# Patient Record
Sex: Female | Born: 1960 | Race: White | Hispanic: No | Marital: Married | Smoking: Light tobacco smoker
Health system: Southern US, Community
[De-identification: ages and names within clinical notes are randomized; demographics above are authoritative.]

---

## 2019-01-21 ENCOUNTER — Emergency Department: Payer: BLUE CROSS/BLUE SHIELD | Admitting: Anesthesiology

## 2019-01-21 ENCOUNTER — Encounter: Payer: Self-pay | Admitting: Emergency Medicine

## 2019-01-21 ENCOUNTER — Emergency Department: Payer: BLUE CROSS/BLUE SHIELD

## 2019-01-21 ENCOUNTER — Encounter
Admission: EM | Disposition: A | Discharge status: Home / Self Care | Payer: Self-pay | Source: Home / Self Care | Attending: Emergency Medicine

## 2019-01-21 ENCOUNTER — Emergency Department
Admission: EM | Admit: 2019-01-21 | Discharge: 2019-01-21 | Disposition: A | Discharge status: Home / Self Care | Payer: BLUE CROSS/BLUE SHIELD | Attending: Emergency Medicine | Admitting: Emergency Medicine

## 2019-01-21 ENCOUNTER — Other Ambulatory Visit: Payer: Self-pay

## 2019-01-21 DIAGNOSIS — X58XXXA Exposure to other specified factors, initial encounter: Secondary | ICD-10-CM | POA: Insufficient documentation

## 2019-01-21 DIAGNOSIS — T18128A Food in esophagus causing other injury, initial encounter: Secondary | ICD-10-CM

## 2019-01-21 DIAGNOSIS — K209 Esophagitis, unspecified without bleeding: Secondary | ICD-10-CM

## 2019-01-21 DIAGNOSIS — K222 Esophageal obstruction: Secondary | ICD-10-CM | POA: Diagnosis present

## 2019-01-21 DIAGNOSIS — T18108A Unspecified foreign body in esophagus causing other injury, initial encounter: Secondary | ICD-10-CM

## 2019-01-21 DIAGNOSIS — F17210 Nicotine dependence, cigarettes, uncomplicated: Secondary | ICD-10-CM | POA: Insufficient documentation

## 2019-01-21 DIAGNOSIS — R1310 Dysphagia, unspecified: Secondary | ICD-10-CM | POA: Insufficient documentation

## 2019-01-21 DIAGNOSIS — R911 Solitary pulmonary nodule: Secondary | ICD-10-CM | POA: Diagnosis not present

## 2019-01-21 DIAGNOSIS — Z1159 Encounter for screening for other viral diseases: Secondary | ICD-10-CM | POA: Insufficient documentation

## 2019-01-21 DIAGNOSIS — R1319 Other dysphagia: Secondary | ICD-10-CM

## 2019-01-21 DIAGNOSIS — R0789 Other chest pain: Secondary | ICD-10-CM | POA: Insufficient documentation

## 2019-01-21 DIAGNOSIS — Z79899 Other long term (current) drug therapy: Secondary | ICD-10-CM | POA: Diagnosis not present

## 2019-01-21 HISTORY — PX: ESOPHAGOGASTRODUODENOSCOPY: SHX5428

## 2019-01-21 LAB — CBC WITH DIFFERENTIAL/PLATELET
Abs Immature Granulocytes: 0.04 10*3/uL (ref 0.00–0.07)
Basophils Absolute: 0.1 10*3/uL (ref 0.0–0.1)
Basophils Relative: 1 %
Eosinophils Absolute: 0.2 10*3/uL (ref 0.0–0.5)
Eosinophils Relative: 3 %
HCT: 44.2 % (ref 36.0–46.0)
Hemoglobin: 15 g/dL (ref 12.0–15.0)
Immature Granulocytes: 1 %
Lymphocytes Relative: 19 %
Lymphs Abs: 1.7 10*3/uL (ref 0.7–4.0)
MCH: 31.7 pg (ref 26.0–34.0)
MCHC: 33.9 g/dL (ref 30.0–36.0)
MCV: 93.4 fL (ref 80.0–100.0)
Monocytes Absolute: 0.4 10*3/uL (ref 0.1–1.0)
Monocytes Relative: 5 %
Neutro Abs: 6.2 10*3/uL (ref 1.7–7.7)
Neutrophils Relative %: 71 %
Platelets: 278 10*3/uL (ref 150–400)
RBC: 4.73 MIL/uL (ref 3.87–5.11)
RDW: 13.5 % (ref 11.5–15.5)
WBC: 8.6 10*3/uL (ref 4.0–10.5)
nRBC: 0 % (ref 0.0–0.2)

## 2019-01-21 LAB — BASIC METABOLIC PANEL
Anion gap: 12 (ref 5–15)
BUN: 15 mg/dL (ref 6–20)
CO2: 23 mmol/L (ref 22–32)
Calcium: 9.4 mg/dL (ref 8.9–10.3)
Chloride: 110 mmol/L (ref 98–111)
Creatinine, Ser: 0.5 mg/dL (ref 0.44–1.00)
GFR calc Af Amer: >60 mL/min (ref >60)
GFR calc non Af Amer: >60 mL/min (ref >60)
Glucose, Bld: 100 mg/dL — ABNORMAL HIGH (ref 70–99)
Potassium: 3.8 mmol/L (ref 3.5–5.1)
Sodium: 145 mmol/L (ref 135–145)

## 2019-01-21 LAB — SARS CORONAVIRUS 2 BY RT PCR (HOSPITAL ORDER, PERFORMED IN ~~LOC~~ HOSPITAL LAB): SARS Coronavirus 2: NEGATIVE

## 2019-01-21 SURGERY — EGD (ESOPHAGOGASTRODUODENOSCOPY)
Anesthesia: General | Laterality: Left

## 2019-01-21 MED ORDER — PROPOFOL 500 MG/50ML IV EMUL
INTRAVENOUS | Status: AC
  Filled 2019-01-21: qty 50

## 2019-01-21 MED ORDER — ONDANSETRON HCL 4 MG/2ML IJ SOLN
INTRAMUSCULAR | Status: AC
  Filled 2019-01-21: qty 2

## 2019-01-21 MED ORDER — SODIUM CHLORIDE 0.9 % IV SOLN
INTRAVENOUS | Status: DC
  Administered 2019-01-21: 1000 mL via INTRAVENOUS

## 2019-01-21 MED ORDER — FENTANYL CITRATE (PF) 100 MCG/2ML IJ SOLN: 25.0000 ug | INTRAMUSCULAR | Status: DC | PRN

## 2019-01-21 MED ORDER — SUCCINYLCHOLINE CHLORIDE 20 MG/ML IJ SOLN
INTRAMUSCULAR | Status: DC | PRN
  Administered 2019-01-21: 100 mg via INTRAVENOUS

## 2019-01-21 MED ORDER — FENTANYL CITRATE (PF) 100 MCG/2ML IJ SOLN
INTRAMUSCULAR | Status: DC | PRN
  Administered 2019-01-21: 100 ug via INTRAVENOUS

## 2019-01-21 MED ORDER — LIDOCAINE HCL (PF) 2 % IJ SOLN
INTRAMUSCULAR | Status: AC
  Filled 2019-01-21: qty 10

## 2019-01-21 MED ORDER — GLYCOPYRROLATE 0.2 MG/ML IJ SOLN
INTRAMUSCULAR | Status: DC | PRN
  Administered 2019-01-21: 0.2 mg via INTRAVENOUS

## 2019-01-21 MED ORDER — ACETAMINOPHEN 325 MG PO TABS: 325.0000 mg | ORAL_TABLET | ORAL | Status: DC | PRN

## 2019-01-21 MED ORDER — PROMETHAZINE HCL 25 MG/ML IJ SOLN: 6.2500 mg | INTRAMUSCULAR | Status: DC | PRN

## 2019-01-21 MED ORDER — PROPOFOL 500 MG/50ML IV EMUL
INTRAVENOUS | Status: DC | PRN
  Administered 2019-01-21: 125 ug/kg/min via INTRAVENOUS

## 2019-01-21 MED ORDER — MEPERIDINE HCL 25 MG/ML IJ SOLN
6.2500 mg | INTRAMUSCULAR | Status: DC | PRN
  Filled 2019-01-21: qty 1

## 2019-01-21 MED ORDER — ACETAMINOPHEN 160 MG/5ML PO SOLN
325.0000 mg | ORAL | Status: DC | PRN
  Filled 2019-01-21: qty 20.3

## 2019-01-21 MED ORDER — GLUCAGON HCL RDNA (DIAGNOSTIC) 1 MG IJ SOLR
1.0000 mg | Freq: Once | INTRAMUSCULAR | Status: AC
  Administered 2019-01-21: 1 mg via INTRAVENOUS

## 2019-01-21 MED ORDER — GLUCAGON HCL RDNA (DIAGNOSTIC) 1 MG IJ SOLR
INTRAMUSCULAR | Status: AC
  Administered 2019-01-21: 09:00:00 1 mg via INTRAVENOUS
  Filled 2019-01-21: qty 1

## 2019-01-21 MED ORDER — MORPHINE SULFATE (PF) 4 MG/ML IV SOLN
4.0000 mg | Freq: Once | INTRAVENOUS | Status: AC
  Administered 2019-01-21: 4 mg via INTRAVENOUS
  Filled 2019-01-21: qty 1

## 2019-01-21 MED ORDER — LIDOCAINE HCL (CARDIAC) PF 100 MG/5ML IV SOSY
PREFILLED_SYRINGE | INTRAVENOUS | Status: DC | PRN
  Administered 2019-01-21: 50 mg via INTRAVENOUS

## 2019-01-21 MED ORDER — ONDANSETRON HCL 4 MG/2ML IJ SOLN
4.0000 mg | Freq: Once | INTRAMUSCULAR | Status: AC
  Administered 2019-01-21: 4 mg via INTRAVENOUS
  Filled 2019-01-21: qty 2

## 2019-01-21 MED ORDER — ONDANSETRON HCL 4 MG/2ML IJ SOLN
INTRAMUSCULAR | Status: DC | PRN
  Administered 2019-01-21: 4 mg via INTRAVENOUS

## 2019-01-21 MED ORDER — MIDAZOLAM HCL 2 MG/2ML IJ SOLN
INTRAMUSCULAR | Status: AC
  Filled 2019-01-21: qty 2

## 2019-01-21 MED ORDER — PANTOPRAZOLE SODIUM 20 MG PO TBEC: 20.0000 mg | DELAYED_RELEASE_TABLET | Freq: Two times a day (BID) | ORAL | 0 refills | Status: AC

## 2019-01-21 MED ORDER — GLYCOPYRROLATE 0.2 MG/ML IJ SOLN
INTRAMUSCULAR | Status: AC
  Filled 2019-01-21: qty 1

## 2019-01-21 MED ORDER — SODIUM CHLORIDE 0.9 % IV BOLUS
500.0000 mL | Freq: Once | INTRAVENOUS | Status: AC
  Administered 2019-01-21: 09:00:00 500 mL via INTRAVENOUS

## 2019-01-21 MED ORDER — PROPOFOL 10 MG/ML IV BOLUS
INTRAVENOUS | Status: DC | PRN
  Administered 2019-01-21: 150 mg via INTRAVENOUS

## 2019-01-21 MED ORDER — SUCCINYLCHOLINE CHLORIDE 20 MG/ML IJ SOLN
INTRAMUSCULAR | Status: AC
  Filled 2019-01-21: qty 1

## 2019-01-21 MED ORDER — FENTANYL CITRATE (PF) 100 MCG/2ML IJ SOLN
INTRAMUSCULAR | Status: AC
  Filled 2019-01-21: qty 2

## 2019-01-21 MED ORDER — MIDAZOLAM HCL 2 MG/2ML IJ SOLN
INTRAMUSCULAR | Status: DC | PRN
  Administered 2019-01-21: 2 mg via INTRAVENOUS

## 2019-01-21 NOTE — Transfer of Care (Signed)
Immediate Anesthesia Transfer of Care Note  Patient: Deanna Gonzalez  Procedure(s) Performed: [name] (EGD) (Left )  Patient Location: PACU  Anesthesia Type:General  Level of Consciousness: awake, alert  and oriented  Airway & Oxygen Therapy: Patient Spontanous Breathing and Patient connected to face mask oxygen  Post-op Assessment: Report given to RN and Post -op Vital signs reviewed and stable  Post vital signs: Reviewed  Last Vitals:  Vitals Value Taken Time  BP    Temp    Pulse 88 01/21/19 1426  Resp 17 01/21/19 1426  SpO2 97 % 01/21/19 1426  Vitals shown include unvalidated device data.  Last Pain:  Vitals:   01/21/19 1303  TempSrc: Tympanic         Complications: No apparent anesthesia complications

## 2019-01-21 NOTE — ED Notes (Signed)
Endoscopy called and stated they are ready for pt

## 2019-01-21 NOTE — ED Notes (Signed)
Endoscopy called and stated that it would be about an hour before they could get the pt

## 2019-01-21 NOTE — ED Provider Notes (Signed)
[facility] Emergency Department Provider Note  ____________________________________________  Time seen: Approximately 10:01 AM  I have reviewed the triage vital signs and the nursing notes.   HISTORY  Chief Complaint Emesis and Possible Food Bolus    HPI Deanna Gonzalez is a 58 y.o. female with no significant past medical history who was in her usual state of health when she ate some chicken last night as a late dinner and had a sudden onset of low chest pain.  It has been constant without aggravating or alleviating factors, associated with inability to eat or drink anything.  Whenever she drinks water, she vomits it back up within a few minutes.  Pain is nonradiating.  Severe.  She reports in the past she has had episodes of similar chest discomfort that is temporary whenever she is eating fast and not chewing her food well.  Denies any history of esophageal stricture or intervention.      History reviewed. No pertinent past medical history.   There are no active problems to display for this patient.    History reviewed. No pertinent surgical history.   Prior to Admission medications   Not on File   None  Allergies Patient has no known allergies.   History reviewed. No pertinent family history.  Social History Social History   Tobacco Use  . Smoking status: Light Tobacco Smoker  Substance Use Topics  . Alcohol use: Not on file  . Drug use: Not on file  No alcohol use  Review of Systems  Constitutional:   No fever or chills.  ENT:   No sore throat. No rhinorrhea. Cardiovascular:   Positive as above chest pain without syncope. Respiratory:   No dyspnea or cough. Gastrointestinal:   Negative for abdominal pain, vomiting and diarrhea.  Musculoskeletal:   Negative for focal pain or swelling All other systems reviewed and are negative except as documented above in ROS and HPI.  ____________________________________________   PHYSICAL  EXAM:  VITAL SIGNS: ED Triage Vitals  Enc Vitals Group     BP 01/21/19 0812 (!) 154/104     Pulse Rate 01/21/19 0812 79     Resp 01/21/19 0812 18     Temp 01/21/19 0812 98.2 F (36.8 C)     Temp Source 01/21/19 0812 Oral     SpO2 01/21/19 0812 98 %     Weight 01/21/19 0815 148 lb (67.1 kg)     Height 01/21/19 0815 5\' 5"  (1.651 m)     Head Circumference --      Peak Flow --      Pain Score --      Pain Loc --      Pain Edu? --      Excl. in GC? --     Vital signs reviewed, nursing assessments reviewed.   Constitutional:   Alert and oriented. Non-toxic appearance. Eyes:   Conjunctivae are normal. EOMI. PERRL. ENT      Head:   Normocephalic and atraumatic.      Nose:   No congestion/rhinnorhea.       Mouth/Throat:   MMM, no pharyngeal erythema. No peritonsillar mass.       Neck:   No meningismus. Full ROM. Hematological/Lymphatic/Immunilogical:   No cervical lymphadenopathy. Cardiovascular:   RRR. Symmetric bilateral radial and DP pulses.  No murmurs. Cap refill less than 2 seconds. Respiratory:   Normal respiratory effort without tachypnea/retractions. Breath sounds are clear and equal bilaterally. No wheezes/rales/rhonchi. Gastrointestinal:   Soft and  nontender. Non distended. There is no CVA tenderness.  No rebound, rigidity, or guarding.  Musculoskeletal:   Normal range of motion in all extremities. No joint effusions.  No lower extremity tenderness.  No edema. Neurologic:   Normal speech and language.  Motor grossly intact. No acute focal neurologic deficits are appreciated.  Skin:    Skin is warm, dry and intact. No rash noted.  No petechiae, purpura, or bullae.  ____________________________________________    LABS (pertinent positives/negatives) (all labs ordered are listed, but only abnormal results are displayed) Labs Reviewed  BASIC METABOLIC PANEL - Abnormal; Notable for the following components:      Result Value   Glucose, Bld 100 (*)    All other  components within normal limits  SARS CORONAVIRUS 2 (HOSPITAL ORDER, [facility])  CBC WITH DIFFERENTIAL/PLATELET   ____________________________________________   EKG  Interpreted by me Sinus rhythm rate of 85, normal axis and intervals.  Normal QRS ST segments and T waves.  ____________________________________________    RKYHCWCBJ  Dg Chest Portable 1 View  Result Date: 01/21/2019 CLINICAL DATA:  Chest pain. Possible esophageal impaction. EXAM: PORTABLE CHEST 1 VIEW COMPARISON:  None. FINDINGS: Heart size and mediastinal contours are within normal limits. Small dense nodule within the RIGHT lower lung, almost certainly a calcified granuloma. Lungs otherwise clear. No evidence of pneumonia or pulmonary edema. No pleural effusion or pneumothorax seen. Osseous structures about the chest are unremarkable. IMPRESSION: No active disease. No evidence of pneumonia or pulmonary edema. Electronically Signed   By: Franki Cabot M.D.   On: 01/21/2019 08:50    ____________________________________________   PROCEDURES Procedures  ____________________________________________    CLINICAL IMPRESSION / ASSESSMENT AND PLAN / ED COURSE  Medications ordered in the ED: Medications  sodium chloride 0.9 % bolus 500 mL (0 mLs Intravenous Stopped 01/21/19 0944)  glucagon (human recombinant) (GLUCAGEN) injection 1 mg (1 mg Intravenous Given 01/21/19 0856)  morphine 4 MG/ML injection 4 mg (4 mg Intravenous Given 01/21/19 1023)  ondansetron (ZOFRAN) injection 4 mg (4 mg Intravenous Given 01/21/19 1021)    Pertinent labs & imaging results that were available during my care of the patient were reviewed by me and considered in my medical decision making (see chart for details).  Deanna Gonzalez was evaluated in Emergency Department on 01/21/2019 for the symptoms described in the history of present illness. She was evaluated in the context of the global COVID-19 pandemic, which  necessitated consideration that the patient might be at risk for infection with the SARS-CoV-2 virus that causes COVID-19. Institutional protocols and algorithms that pertain to the evaluation of patients at risk for COVID-19 are in a state of rapid change based on information released by regulatory bodies including the CDC and federal and state organizations. These policies and algorithms were followed during the patient's care in the ED.   Patient presents with symptoms of esophageal food impaction.Considering the patient's symptoms, medical history, and physical examination today, I have low suspicion for ACS, PE, TAD, pneumothorax, carditis, mediastinitis, pneumonia, CHF, or sepsis.  Vital signs and EKG unremarkable.  Mildly elevated blood pressure likely due to discomfort.  Patient strongly wishes to avoid endoscopy if at all possible so I will try glucagon and sips of soda to see if this will relieve her symptoms.  Clinical Course as of Jan 21 1056  Sun Jan 21, 2019  0930 Labs and chest x-ray unremarkable.  Follow-up response to glucagon and soda.   [PS]  [place]  negative.  Despite morphine glucagon and sips of soda, no improvement of symptoms.  Will page GI for endoscopy.  Patient is agreeable   [PS]    Clinical Course User Index [PS] Sharman CheekStafford, Berdell Nevitt, MD     ____________________________________________   FINAL CLINICAL IMPRESSION(S) / ED DIAGNOSES    Final diagnoses:  Esophageal obstruction due to food impaction  Atypical chest pain     ED Discharge Orders    None      Portions of this note were generated with dragon dictation software. Dictation errors may occur despite best attempts at proofreading.   Sharman CheekStafford, Crockett Rallo, MD 01/21/19 1057

## 2019-01-21 NOTE — ED Triage Notes (Signed)
Pt arrived via POV with reports of vomiting and possible food bolus. Pt states yesterday she was eating chicken and feels like it is stuck in her chest. Pt states when she drinks water it comes back up about 3 minutes later.  Pt denies abdominal pain.

## 2019-01-21 NOTE — Consult Note (Signed)
Melodie BouillonVarnita Tranise Forrest, MD [address], [address], [place], [place], [address] [address], [address], [place], [place], [address] Phone: [phone]  Fax: [phone]  Consultation  Referring Provider:     Dr. Scotty CourtStafford Primary Care Physician:  System, Provider Not In Reason for Consultation:     Food impaction  Date of Admission:  01/21/2019 Date of Consultation:  01/21/2019         HPI:   Deanna AhmadiWanda Gonzalez is a 58 y.o. female who ate chicken with dinner yesterday and has inability to eat or drink anything since then. Denies any previous history of food impaction or EGDs. Does report dysphagia intermittently with solid food in the past. States it only happens twice a year. Doesn't feel it gets stuck but it slows down and eventually passes through with water.     History reviewed. No pertinent past medical history.  History reviewed. No pertinent surgical history.  Prior to Admission medications   Not on File    History reviewed. No pertinent family history.   Social History   Tobacco Use  . Smoking status: Light Tobacco Smoker  Substance Use Topics  . Alcohol use: Not on file  . Drug use: Not on file    Allergies as of 01/21/2019  . (No Known Allergies)    Review of Systems:    All systems reviewed and negative except where noted in HPI.   Physical Exam:  Vital signs in last 24 hours: Vitals:   01/21/19 0812 01/21/19 0815 01/21/19 0930  BP: (!) 154/104  (!) 152/96  Pulse: 79    Resp: 18  13  Temp: 98.2 F (36.8 C)    TempSrc: Oral    SpO2: 98%    Weight:  67.1 kg   Height:  5\' 5"  (1.651 m)      General:   Pleasant, cooperative in NAD Head:  Normocephalic and atraumatic. Eyes:   No icterus.   Conjunctiva pink. PERRLA. Ears:  Normal auditory acuity. Neck:  Supple; no masses or thyroidomegaly Lungs: Respirations even and unlabored. Lungs clear to auscultation bilaterally.   No wheezes, crackles, or rhonchi.  Abdomen:  Soft, nondistended, nontender. Normal  bowel sounds. No appreciable masses or hepatomegaly.  No rebound or guarding.  Neurologic:  Alert and oriented x3;  grossly normal neurologically. Skin:  Intact without significant lesions or rashes. Cervical Nodes:  No significant cervical adenopathy. Psych:  Alert and cooperative. Normal affect.  LAB RESULTS: Recent Labs    01/21/19 0850  WBC 8.6  HGB 15.0  HCT 44.2  PLT 278   BMET Recent Labs    01/21/19 0850  NA 145  K 3.8  CL 110  CO2 23  GLUCOSE 100*  BUN 15  CREATININE 0.50  CALCIUM 9.4   LFT No results for input(s): PROT, ALBUMIN, AST, ALT, ALKPHOS, BILITOT, BILIDIR, IBILI in the last 72 hours. PT/INR No results for input(s): LABPROT, INR in the last 72 hours.  STUDIES: Dg Chest Portable 1 View  Result Date: 01/21/2019 CLINICAL DATA:  Chest pain. Possible esophageal impaction. EXAM: PORTABLE CHEST 1 VIEW COMPARISON:  None. FINDINGS: Heart size and mediastinal contours are within normal limits. Small dense nodule within the RIGHT lower lung, almost certainly a calcified granuloma. Lungs otherwise clear. No evidence of pneumonia or pulmonary edema. No pleural effusion or pneumothorax seen. Osseous structures about the chest are unremarkable. IMPRESSION: No active disease. No evidence of pneumonia or pulmonary edema. Electronically Signed   By: Anne NgStan  Maynard M.D.  On: 01/21/2019 08:50      Impression / Plan:   Deanna Gonzalez is a 58 y.o. y/o female with symptoms of food impaction after eating chicken yesterday  Glucagon administered in the ER without relief of symptoms  EGD indicated for further evaluation and removal of food bolus.   Possible stricture, EoE vs achalasia are on the differential  Keep pt NPO until procedure  Pt advised to follow up in GI clinic as an outpatient as well within 2-4 weeks for further workup of intermittent dysphagia  I have discussed alternative options, risks & benefits,  which include, but are not limited to, bleeding,  infection, perforation,respiratory complication & drug reaction.  The patient agrees with this plan & written consent will be obtained.     Thank you for involving me in the care of this patient.      LOS: 0 days   Virgel Manifold, MD  01/21/2019, 11:27 AM

## 2019-01-21 NOTE — Anesthesia Postprocedure Evaluation (Signed)
Anesthesia Post Note  Patient: Deanna Gonzalez  Procedure(s) Performed: [name] (EGD) (Left )  Patient location during evaluation: PACU Anesthesia Type: General Level of consciousness: awake and alert Pain management: pain level controlled Vital Signs Assessment: post-procedure vital signs reviewed and stable Respiratory status: spontaneous breathing, nonlabored ventilation and respiratory function stable Cardiovascular status: blood pressure returned to baseline and stable Postop Assessment: no apparent nausea or vomiting Anesthetic complications: no     Last Vitals:  Vitals:   01/21/19 1455 01/21/19 1507  BP: 137/86 (!) 147/89  Pulse: 83 74  Resp: 13 14  Temp: 37.5 C   SpO2: 100% 98%    Last Pain:  Vitals:   01/21/19 1507  TempSrc:   PainSc: 0-No pain                 Alphonsus Sias

## 2019-01-21 NOTE — Op Note (Addendum)
[facility] Gastroenterology Patient Name: Deanna AhmadiWanda Gonzalez Procedure Date: 01/21/2019 1:05 PM MRN: [id number] Account #: [id number] Date of Birth: 02-26-1961 Admit Type: Inpatient Age: [id number] [facility] ENDO ROOM 4 Gender: Female Note Status: Finalized Procedure:            Upper GI endoscopy Indications:          Dysphagia, [name], [name], [name], and patient medications, [name], the                        nurse, the anesthesiologist, [name], the endoscope was  passed under direct vision. Throughout the procedure,                        the patient's blood pressure, pulse, and oxygen                        saturations were monitored continuously. The Endoscope                        was introduced through the  mouth, [name], [name], [name], second portion of the duodenum                        and examined duodenum. Recommendation:       - Await pathology results.                       - Discharge patient to home (with escort).                       - Soft diet for 1 week.                       -  Follow an antireflux regimen.                       - Take prescribed proton pump inhibitor or H2 blocker                        (antacid) medications 30 - 60 minutes before meals.                       - Return to GI clinic in 2 weeks. - Pt lives in                        [place] and does not have a PCP. I have explained                         the importance of following up with a GI physician for                        detailed workup of her symptoms and to avoid future                        food impactions. I have also recommended that she get a                        screening colonoscopy and establish a PCP. she states                        she will look up a GI physician locally and make an                        appointment with them.                       - Return to primary care physician in 2 weeks.                       - Continue present medications.                       - Patient has a contact number available for                        emergencies. The signs and symptoms of potential                        delayed complications were discussed with the patient.                        Return to normal activities tomorrow. Written discharge                        instructions were provided to the patient.                       - Discharge patient to home (with escort).                       - The findings and recommendations were discussed with  the patient. Procedure Code(s):    --- Professional ---                       [id number], [name], flexible, transoral;                        with removal of foreign body(s)                       43239, [name], flexible, transoral;                        with biopsy, [name], [name],                        [name], Esophagitis, [name], Dysphagia, [name], [name], [name], [name] Maximino Greenlandahiliani MD, MD 01/21/2019 2:25:45 PM This report has been signed electronically. Number of Addenda: 0 Note Initiated On: 01/21/2019 1:05 PM Estimated Blood Loss: Estimated blood loss: none.      [facility]

## 2019-01-21 NOTE — Anesthesia Post-op Follow-up Note (Signed)
Anesthesia QCDR form completed.        

## 2019-01-21 NOTE — Anesthesia Procedure Notes (Signed)
Procedure Name: Intubation Performed by: Kenniyah Sasaki, CRNA Pre-anesthesia Checklist: Patient identified, Patient being monitored, Timeout performed, Emergency Drugs available and Suction available Patient Re-evaluated:Patient Re-evaluated prior to induction Oxygen Delivery Method: Circle system utilized Preoxygenation: Pre-oxygenation with 100% oxygen Induction Type: IV induction, Rapid sequence and Cricoid Pressure applied Laryngoscope Size: Miller and 2 Grade View: Grade I Tube type: Oral Tube size: 7.0 mm Number of attempts: 1 Airway Equipment and Method: Stylet Placement Confirmation: ETT inserted through vocal cords under direct vision,  positive ETCO2 and breath sounds checked- equal and bilateral Secured at: 21 cm Tube secured with: Tape Dental Injury: Teeth and Oropharynx as per pre-operative assessment        

## 2019-01-21 NOTE — ED Notes (Signed)
EDP at bedside  

## 2019-01-21 NOTE — OR Nursing (Signed)
Discharge instructions discussed with patient and her cousin Deanna Gonzalez [place]).  Written instructions given to patient.  Patient will either stay at cousin's home overnight or cousin will stay at local hotel overnight to be with patient.

## 2019-01-21 NOTE — Anesthesia Preprocedure Evaluation (Signed)
Anesthesia Evaluation  Patient identified by MRN, date of birth, ID band Patient awake    Reviewed: Allergy & Precautions, H&P , NPO status , reviewed documented beta blocker date and time   Airway Mallampati: II  TM Distance: >3 FB Neck ROM: full    Dental  (+) Chipped   Pulmonary Current Smoker,  Smoked a few cigarettes during her father's funeral yesterday, otherwise is a non-smoker.   Pulmonary exam normal        Cardiovascular Normal cardiovascular exam     Neuro/Psych    GI/Hepatic   Endo/Other    Renal/GU      Musculoskeletal   Abdominal   Peds  Hematology   Anesthesia Other Findings History reviewed. No pertinent past medical history. History reviewed. No pertinent surgical history. BMI    Body Mass Index: 24.63 kg/m     Reproductive/Obstetrics                             Anesthesia Physical Anesthesia Plan  ASA: II and emergent  Anesthesia Plan: General   Post-op Pain Management:    Induction: Intravenous  PONV Risk Score and Plan: 2 and Ondansetron, Midazolam and Treatment may vary due to age or medical condition  Airway Management Planned: Oral ETT  Additional Equipment:   Intra-op Plan:   Post-operative Plan: Extubation in OR  Informed Consent: I have reviewed the patients History and Physical, chart, labs and discussed the procedure including the risks, benefits and alternatives for the proposed anesthesia with the patient or authorized representative who has indicated his/her understanding and acceptance.     Dental Advisory Given  Plan Discussed with: CRNA  Anesthesia Plan Comments:         Anesthesia Quick Evaluation

## 2019-01-22 ENCOUNTER — Encounter: Payer: Self-pay | Admitting: Gastroenterology

## 2019-01-23 LAB — SURGICAL PATHOLOGY

## 2019-02-11 ENCOUNTER — Other Ambulatory Visit: Payer: Self-pay | Admitting: Gastroenterology

## 2019-02-26 ENCOUNTER — Other Ambulatory Visit: Payer: Self-pay

## 2019-02-26 ENCOUNTER — Telehealth: Payer: Self-pay

## 2019-02-26 MED ORDER — PANTOPRAZOLE SODIUM 20 MG PO TBEC: 20.0000 mg | DELAYED_RELEASE_TABLET | Freq: Two times a day (BID) | ORAL | 0 refills | Status: AC

## 2019-02-26 NOTE — Telephone Encounter (Signed)
Left patient a detail message that this was refilled for patient and sent medication to pharmacy

## 2019-02-26 NOTE — Telephone Encounter (Signed)
-----   Message from Virgel Manifold, MD sent at 02/20/2019  2:00 PM EDT ----- Please call this patient to let her know that her biopsies do suggest that she may have eosinophilic esophagitis that caused her food impaction.  She should follow-up with a gastroenterologist near her as soon as possible to get appropriate treatment avoid future episodes of food impaction.  She should take the medication we prescribed as well.  Please ask her the name of her primary care provider and/or gastroenterologist and forward them both my upper endoscopy results and these biopsy results.  If she does not have a primary care provider, please tell her she should establish one as soon as possible.  You can also offer her to follow-up with [place] in our clinic and schedule follow-up with me if she agrees.

## 2019-02-26 NOTE — Telephone Encounter (Signed)
Patient verbalized understanding of lab results. Patient states she does not have a PCP or gastroenterologist at this time but will get one. She states she took the Pantoprazole for 1 month and has not had any more problems. Patient wants to know if you want her to keep taking this medication till she see a GI doctor. If you do I pended the medication and pharmacy.

## 2019-02-26 NOTE — Telephone Encounter (Signed)
-----   Message from Virgel Manifold, MD sent at 02/26/2019 10:51 AM EDT ----- You sent me a refill request on this patient.  I tried to sign it but it gave me an error message, do not know if it went through.  It said something about changing the pharmacy to what she is requesting.  Can you check in on that.  Also, in regard to your message about her.  Please call her and let her know that this is the last medication we are going to be able to give her, for 30 days and she should see a gastroenterologist or primary care doctor within this time, To obtain further management and refills.

## 2019-02-26 NOTE — Telephone Encounter (Signed)
Called and left a message for call back to discuss labs results

## 2019-02-26 NOTE — Telephone Encounter (Signed)
Patient is returning  call for [facility].

## 2020-10-30 IMAGING — DX PORTABLE CHEST - 1 VIEW
1 series · 1 of 1 positions shown · non-contrast
Comparison: None.

CLINICAL DATA: Chest pain. Possible esophageal impaction.

EXAM:
PORTABLE CHEST 1 VIEW

[chest ap]
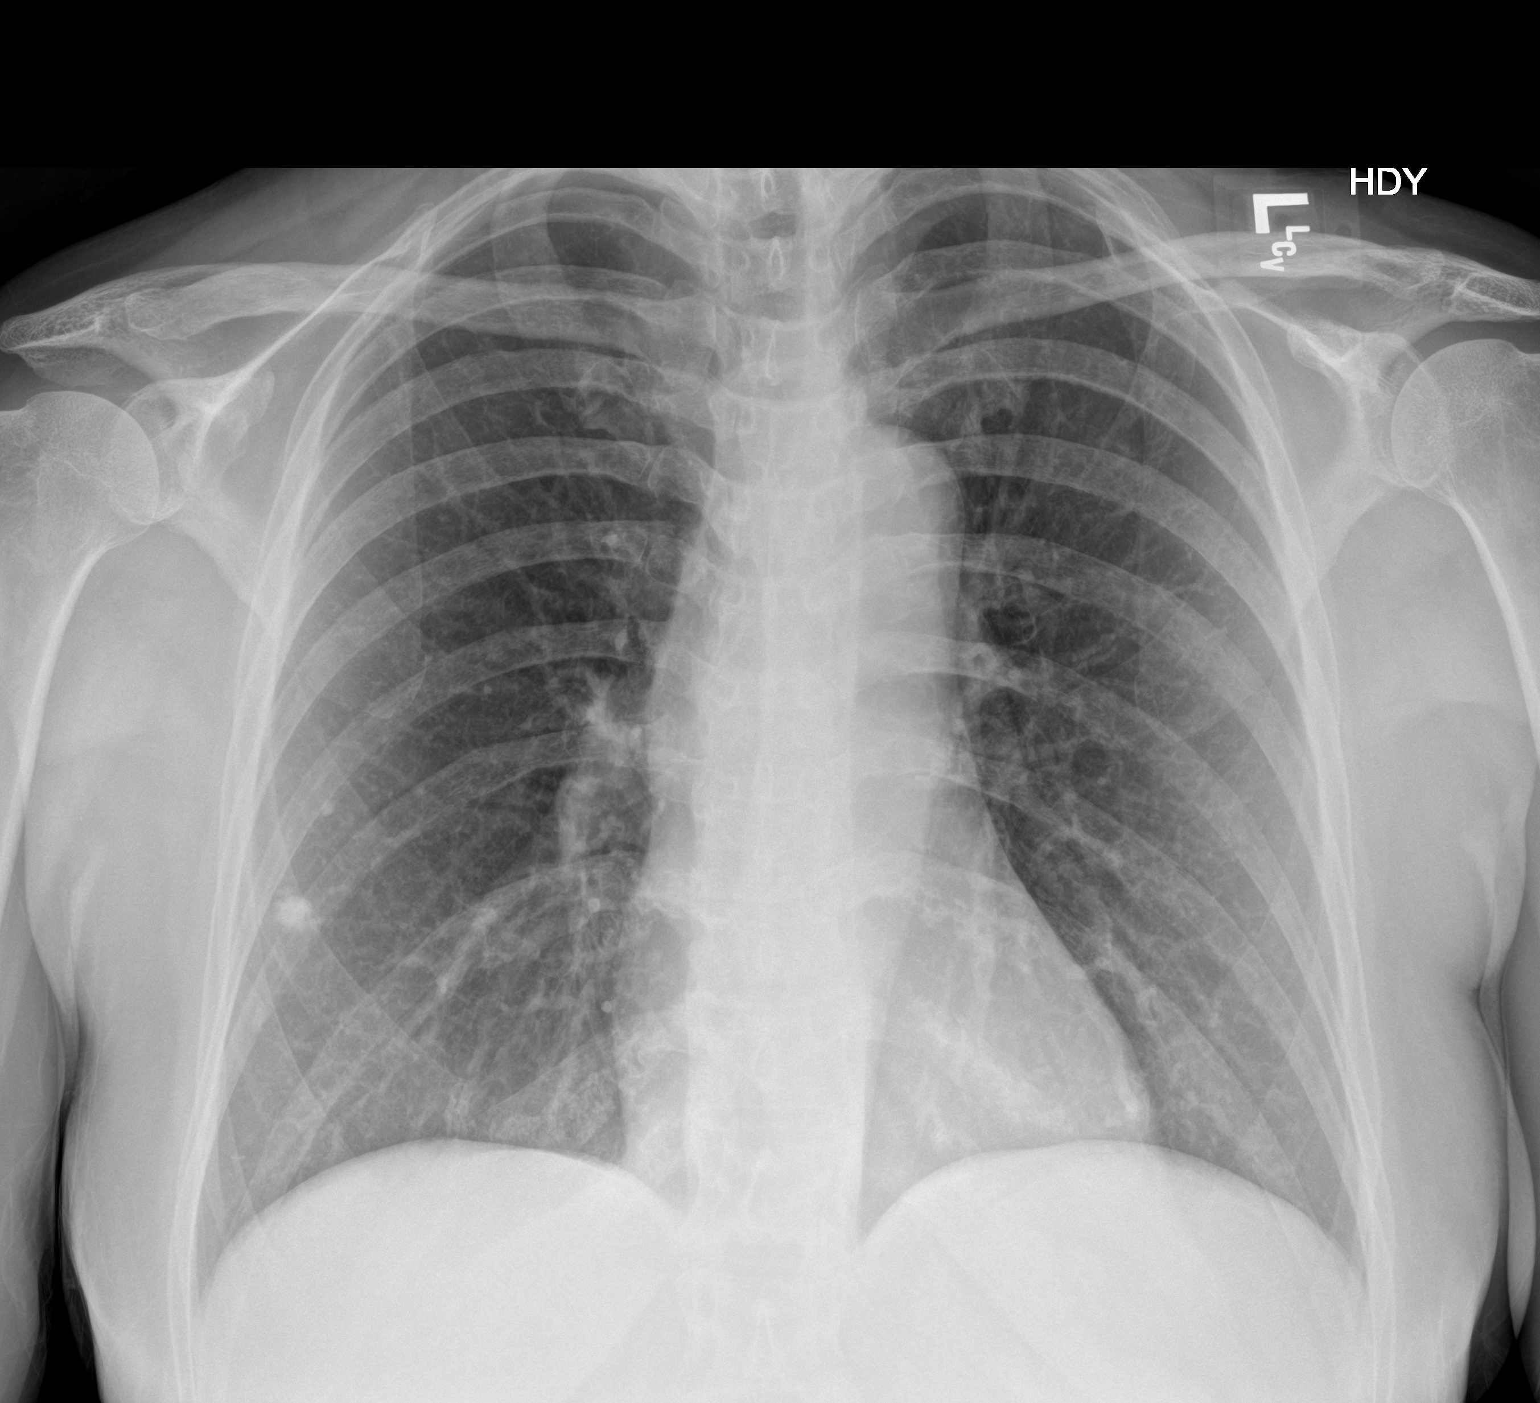

[1 of 1 positions shown; findings below may reference images not displayed]

FINDINGS: Heart size and mediastinal contours are within normal limits. Small
dense nodule within the RIGHT lower lung, almost certainly a
calcified granuloma. Lungs otherwise clear. No evidence of pneumonia
or pulmonary edema. No pleural effusion or pneumothorax seen.
Osseous structures about the chest are unremarkable.
IMPRESSION: No active disease. No evidence of pneumonia or pulmonary edema.
